# Patient Record
Sex: Female | Born: 1995 | Race: White | Hispanic: No | Marital: Single | Smoking: Never smoker
Health system: Southern US, Community
[De-identification: ages and names within clinical notes are randomized; demographics above are authoritative.]

## PROBLEM LIST (undated history)

## (undated) DIAGNOSIS — F909 Attention-deficit hyperactivity disorder, unspecified type: Secondary | ICD-10-CM

## (undated) DIAGNOSIS — J309 Allergic rhinitis, unspecified: Secondary | ICD-10-CM

## (undated) DIAGNOSIS — N6019 Diffuse cystic mastopathy of unspecified breast: Secondary | ICD-10-CM

## (undated) DIAGNOSIS — E669 Obesity, unspecified: Secondary | ICD-10-CM

## (undated) DIAGNOSIS — R519 Headache, unspecified: Secondary | ICD-10-CM

## (undated) DIAGNOSIS — A64 Unspecified sexually transmitted disease: Secondary | ICD-10-CM

## (undated) HISTORY — PX: TONSILLECTOMY: SUR1361

## (undated) HISTORY — DX: Attention-deficit hyperactivity disorder, unspecified type: F90.9

## (undated) HISTORY — DX: Obesity, unspecified: E66.9

## (undated) HISTORY — DX: Diffuse cystic mastopathy of unspecified breast: N60.19

## (undated) HISTORY — PX: TONSILLECTOMY AND ADENOIDECTOMY: SHX28

## (undated) HISTORY — DX: Headache, unspecified: R51.9

## (undated) HISTORY — DX: Unspecified sexually transmitted disease: A64

## (undated) HISTORY — DX: Allergic rhinitis, unspecified: J30.9

---
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
## 2016-03-31 ENCOUNTER — Other Ambulatory Visit
Admission: RE | Admit: 2016-03-31 | Discharge: 2016-03-31 | Disposition: A | Discharge status: Home / Self Care | Payer: Medicaid Other | Source: Ambulatory Visit | Attending: Pediatrics | Admitting: Pediatrics

## 2016-03-31 DIAGNOSIS — R109 Unspecified abdominal pain: Secondary | ICD-10-CM | POA: Diagnosis present

## 2016-03-31 LAB — COMPREHENSIVE METABOLIC PANEL
ALT: 20 U/L (ref 14–54)
ANION GAP: 6 (ref 5–15)
AST: 20 U/L (ref 15–41)
Albumin: 4.4 g/dL (ref 3.5–5.0)
Alkaline Phosphatase: 56 U/L (ref 38–126)
BILIRUBIN TOTAL: 0.4 mg/dL (ref 0.3–1.2)
BUN: 12 mg/dL (ref 6–20)
CO2: 29 mmol/L (ref 22–32)
Calcium: 9.6 mg/dL (ref 8.9–10.3)
Chloride: 104 mmol/L (ref 101–111)
Creatinine, Ser: 0.65 mg/dL (ref 0.44–1.00)
GFR calc Af Amer: >60 mL/min (ref >60)
Glucose, Bld: 77 mg/dL (ref 65–99)
POTASSIUM: 3.7 mmol/L (ref 3.5–5.1)
Sodium: 139 mmol/L (ref 135–145)
TOTAL PROTEIN: 7.9 g/dL (ref 6.5–8.1)

## 2016-03-31 LAB — CBC WITH DIFFERENTIAL/PLATELET
BASOS ABS: 0 10*3/uL (ref 0–0.1)
BASOS PCT: 1 %
EOS PCT: 2 %
Eosinophils Absolute: 0.2 10*3/uL (ref 0–0.7)
HCT: 41.9 % (ref 35.0–47.0)
Hemoglobin: 14.4 g/dL (ref 12.0–16.0)
LYMPHS PCT: 29 %
Lymphs Abs: 2.6 10*3/uL (ref 1.0–3.6)
MCH: 28.6 pg (ref 26.0–34.0)
MCHC: 34.3 g/dL (ref 32.0–36.0)
MCV: 83.3 fL (ref 80.0–100.0)
MONO ABS: 0.8 10*3/uL (ref 0.2–0.9)
MONOS PCT: 9 %
Neutro Abs: 5.2 10*3/uL (ref 1.4–6.5)
Neutrophils Relative %: 59 %
PLATELETS: 228 10*3/uL (ref 150–440)
RBC: 5.03 MIL/uL (ref 3.80–5.20)
RDW: 12.6 % (ref 11.5–14.5)
WBC: 8.8 10*3/uL (ref 3.6–11.0)

## 2016-03-31 LAB — LIPASE, BLOOD: Lipase: 20 U/L (ref 11–51)

## 2016-03-31 LAB — TSH: TSH: 2.4 u[IU]/mL (ref 0.350–4.500)

## 2016-03-31 LAB — AMYLASE: AMYLASE: 63 U/L (ref 28–100)

## 2016-04-01 LAB — VITAMIN D 25 HYDROXY (VIT D DEFICIENCY, FRACTURES): Vit D, 25-Hydroxy: 26.8 ng/mL — ABNORMAL LOW (ref 30.0–100.0)

## 2016-04-01 LAB — T4: T4, Total: 6.3 ug/dL (ref 4.5–12.0)

## 2016-11-07 ENCOUNTER — Emergency Department: Payer: Medicaid Other

## 2016-11-07 ENCOUNTER — Encounter: Payer: Self-pay | Admitting: Emergency Medicine

## 2016-11-07 ENCOUNTER — Emergency Department
Admission: EM | Admit: 2016-11-07 | Discharge: 2016-11-07 | Disposition: A | Discharge status: Home / Self Care | Payer: Medicaid Other | Attending: Emergency Medicine | Admitting: Emergency Medicine

## 2016-11-07 DIAGNOSIS — N390 Urinary tract infection, site not specified: Secondary | ICD-10-CM | POA: Insufficient documentation

## 2016-11-07 DIAGNOSIS — R319 Hematuria, unspecified: Secondary | ICD-10-CM

## 2016-11-07 DIAGNOSIS — R1011 Right upper quadrant pain: Secondary | ICD-10-CM

## 2016-11-07 LAB — URINALYSIS, COMPLETE (UACMP) WITH MICROSCOPIC
Bilirubin Urine: NEGATIVE
Glucose, UA: NEGATIVE mg/dL
Ketones, ur: 80 mg/dL — AB
Nitrite: NEGATIVE
PH: 7 (ref 5.0–8.0)
Protein, ur: NEGATIVE mg/dL
SPECIFIC GRAVITY, URINE: 1.019 (ref 1.005–1.030)

## 2016-11-07 LAB — COMPREHENSIVE METABOLIC PANEL
ALT: 22 U/L (ref 14–54)
AST: 23 U/L (ref 15–41)
Albumin: 4.4 g/dL (ref 3.5–5.0)
Alkaline Phosphatase: 54 U/L (ref 38–126)
Anion gap: 7 (ref 5–15)
BUN: 12 mg/dL (ref 6–20)
CALCIUM: 9.5 mg/dL (ref 8.9–10.3)
CHLORIDE: 103 mmol/L (ref 101–111)
CO2: 27 mmol/L (ref 22–32)
CREATININE: 0.65 mg/dL (ref 0.44–1.00)
Glucose, Bld: 96 mg/dL (ref 65–99)
Potassium: 3.9 mmol/L (ref 3.5–5.1)
Sodium: 137 mmol/L (ref 135–145)
Total Bilirubin: 1.5 mg/dL — ABNORMAL HIGH (ref 0.3–1.2)
Total Protein: 8.1 g/dL (ref 6.5–8.1)

## 2016-11-07 LAB — CBC
HCT: 39 % (ref 35.0–47.0)
Hemoglobin: 13.5 g/dL (ref 12.0–16.0)
MCH: 28 pg (ref 26.0–34.0)
MCHC: 34.6 g/dL (ref 32.0–36.0)
MCV: 81 fL (ref 80.0–100.0)
PLATELETS: 215 10*3/uL (ref 150–440)
RBC: 4.81 MIL/uL (ref 3.80–5.20)
RDW: 13.2 % (ref 11.5–14.5)
WBC: 5.9 10*3/uL (ref 3.6–11.0)

## 2016-11-07 LAB — LIPASE, BLOOD: LIPASE: 13 U/L (ref 11–51)

## 2016-11-07 MED ORDER — PHENAZOPYRIDINE HCL 200 MG PO TABS: 200.0000 mg | ORAL_TABLET | Freq: Three times a day (TID) | ORAL | 0 refills | Status: DC | PRN

## 2016-11-07 MED ORDER — SULFAMETHOXAZOLE-TRIMETHOPRIM 800-160 MG PO TABS: 1.0000 | ORAL_TABLET | Freq: Two times a day (BID) | ORAL | 0 refills | Status: DC

## 2016-11-07 NOTE — ED Provider Notes (Signed)
[facility] Emergency Department Provider Note        Time seen: ----------------------------------------- 11:11 AM on 11/07/2016 -----------------------------------------    I have reviewed the triage vital signs and the nursing notes.   HISTORY  Chief Complaint Abdominal Pain    HPI Sandra Hoover is a 21 y.o. female who presents to ER with right upper quadrant pain that started several days ago. Patient was seen by her primary care doctor and evaluated for possible UTI with culture came back negative. She does report dark colored urine, [facility] was [facility]. Patient states symptoms seem to be related to urination. She denies fevers, chills, vomiting or diarrhea.   History reviewed. No pertinent past medical history.  There are no active problems to display for this patient.   History reviewed. No pertinent surgical history.  Allergies Patient has no known allergies.  Social History Social History  Substance Use Topics  . Smoking status: Never Smoker  . Smokeless tobacco: Never Used  . Alcohol use No    Review of Systems Constitutional: Negative for fever. Cardiovascular: Negative for chest pain. Respiratory: Negative for shortness of breath. Gastrointestinal: Positive for abdominal pain Genitourinary: Positive for dysuria Musculoskeletal: Negative for back pain. Skin: Negative for rash. Neurological: Negative for headaches, focal weakness or numbness.  10-point ROS otherwise negative.  ____________________________________________   PHYSICAL EXAM:  VITAL SIGNS: ED Triage Vitals  Enc Vitals Group     BP 11/07/16 1020 130/69     Pulse Rate 11/07/16 1020 75     Resp 11/07/16 1020 20     Temp 11/07/16 1020 98.5 F (36.9 C)     Temp Source 11/07/16 1020 Oral     SpO2 11/07/16 1020 98 %     Weight 11/07/16 1013 237 lb (107.5 kg)     Height 11/07/16 1013 5\' 2"  (1.575 m)     Head  Circumference --      Peak Flow --      Pain Score 11/07/16 1013 4     Pain Loc --      Pain Edu? --      Excl. in GC? --     Constitutional: Alert and oriented. Well appearing and in no distress. Eyes: Conjunctivae are normal. PERRL. Normal extraocular movements. ENT   Head: Normocephalic and atraumatic.   Nose: No congestion/rhinnorhea.   Mouth/Throat: Mucous membranes are moist.   Neck: No stridor. Cardiovascular: Normal rate, regular rhythm. No murmurs, rubs, or gallops. Respiratory: Normal respiratory effort without tachypnea nor retractions. Breath sounds are clear and equal bilaterally. No wheezes/rales/rhonchi. Gastrointestinal: Right upper quadrant tenderness, rebound or guarding. Normal bowel sounds. Musculoskeletal: Nontender with normal range of motion in all extremities. No lower extremity tenderness nor edema. Neurologic:  Normal speech and language. No gross focal neurologic deficits are appreciated.  Skin:  Skin is warm, dry and intact. No rash noted. Psychiatric: Mood and affect are normal. Speech and behavior are normal.  ____________________________________________  ED COURSE:  Pertinent labs & imaging results that were available during my care of the patient were reviewed by me and considered in my medical decision making (see chart for details). Patient presents to ER no distress with right upper quadrant pain. We will assess with labs and likely ultrasound.   Procedures ____________________________________________   LABS (pertinent positives/negatives)  Labs Reviewed  COMPREHENSIVE METABOLIC PANEL - Abnormal; Notable for the following:       Result Value   Total Bilirubin 1.5 (*)  All other components within normal limits  URINALYSIS, COMPLETE (UACMP) WITH MICROSCOPIC - Abnormal; Notable for the following:    Color, Urine YELLOW (*)    APPearance CLOUDY (*)    Hgb urine dipstick LARGE (*)    Ketones, ur 80 (*)    Leukocytes, UA TRACE (*)     Bacteria, UA RARE (*)    Squamous Epithelial / LPF 0-5 (*)    All other components within normal limits  LIPASE, BLOOD  CBC  POC URINE PREG, ED    RADIOLOGY Images were viewed by me  Right upper quadrant ultrasound IMPRESSION: Suspected mild/ early hepatic steatosis. Otherwise, no explanation for patient's right upper quadrant abdominal pain. Specifically, no evidence of cholelithiasis/ cholecystitis. IMPRESSION: No explanation for patient's right upper quadrant abdominal pain. Specifically, no evidence of nephrolithiasis, enteric or urinary obstruction.  ____________________________________________  FINAL ASSESSMENT AND PLAN  Abdominal pain, UTI  Plan: Patient with labs and imaging as dictated above. No explanation for her right-sided abdominal pain. She does have evidence of UTI and she'll be treated for same. She is stable for outpatient follow-up with her doctor.   Emily Filbert, MD   Note: This note was generated in part or whole with voice recognition software. Voice recognition is usually quite accurate but there are transcription errors that can and very often do occur. I apologize for any typographical errors that were not detected and corrected.     Emily Filbert, MD 11/07/16 1310

## 2016-11-07 NOTE — ED Triage Notes (Signed)
Pt to ed with c/o right upper quad pain that started several days ago.  Was seen for UTI, but culture came back negative.  Pt reports dark colored urine.  [facility].

## 2016-11-07 NOTE — ED Notes (Signed)
RN confirmed with ED tech Crystal. POC urine preg completed and result is negative. CT informed of results.

## 2016-11-08 LAB — POCT PREGNANCY, URINE: Preg Test, Ur: NEGATIVE

## 2017-01-01 ENCOUNTER — Ambulatory Visit: Payer: Self-pay | Admitting: Certified Nurse Midwife

## 2017-01-20 ENCOUNTER — Encounter: Payer: Self-pay | Admitting: Certified Nurse Midwife

## 2017-01-20 ENCOUNTER — Ambulatory Visit (INDEPENDENT_AMBULATORY_CARE_PROVIDER_SITE_OTHER): Payer: Medicaid Other | Admitting: Certified Nurse Midwife

## 2017-01-20 VITALS — BP 122/78 | HR 107 | Ht 62.5 in | Wt 221.0 lb

## 2017-01-20 DIAGNOSIS — N926 Irregular menstruation, unspecified: Secondary | ICD-10-CM | POA: Diagnosis not present

## 2017-01-20 DIAGNOSIS — R109 Unspecified abdominal pain: Secondary | ICD-10-CM

## 2017-01-20 DIAGNOSIS — N898 Other specified noninflammatory disorders of vagina: Secondary | ICD-10-CM

## 2017-01-20 DIAGNOSIS — Z87440 Personal history of urinary (tract) infections: Secondary | ICD-10-CM

## 2017-01-20 DIAGNOSIS — Z113 Encounter for screening for infections with a predominantly sexual mode of transmission: Secondary | ICD-10-CM

## 2017-01-20 DIAGNOSIS — N946 Dysmenorrhea, unspecified: Secondary | ICD-10-CM | POA: Diagnosis not present

## 2017-01-20 LAB — POCT URINALYSIS DIPSTICK
Bilirubin, UA: NEGATIVE
Blood, UA: NEGATIVE
Glucose, UA: NEGATIVE
KETONES UA: 1.015
Nitrite, UA: NEGATIVE
PH UA: 6 (ref 5.0–8.0)
SPEC GRAV UA: 1.015 (ref 1.010–1.025)
UROBILINOGEN UA: 0.2 U/dL

## 2017-01-20 MED ORDER — AMOXICILLIN 875 MG PO TABS: 875.0000 mg | ORAL_TABLET | Freq: Two times a day (BID) | ORAL | 0 refills | Status: DC

## 2017-01-20 NOTE — Progress Notes (Signed)
Obstetrics & Gynecology Office Visit   Chief Complaint:  Chief Complaint  Patient presents with  . 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 She currently uses the Nexplanon for contraception. The Nexplanon was inserted 06/2015. She has been amenorrheic for about a year, then had a rather heavy menses in March that lasted 5-6 days and was accompanied by RUQ pain. She reports going to the ER and she was diagnosed with a UTI (E.coli) and was first treated with Ciprofloxacin which was later changed to Bactrim DS due to some side effects of the Ciprofloxacin. She also had a GB ultrasound and CT of the abdomen which were negative for gallstones and renal stones or renal obstruction. The pain resolved until her next menses in April. This pain also radiated to her upper right back. She does not have the pain currently. Has not had a menses this month. Denies nausea and vomiting accompaning the pain. Denies current problems with dysuria, hematuria,  or bad odor to urine. She has noticed more vaginal discharge, but denies fever, lower abdominal pain, or vulvovaginal irritation or itching.  She does not use condoms and has had a recent new partner. History of right flank pain associated with menses and irregular menses prior to Nexplanon insertion.   Review of Systems:  Review of Systems  Constitutional: Negative for chills and fever.       Has gained 29# since Nexplanon inserted  Gastrointestinal: Positive for abdominal pain (see HPI). Negative for constipation, diarrhea, nausea and vomiting.  Genitourinary: Negative for dysuria and hematuria.  Skin: Negative for itching and rash.  Neurological: Negative for dizziness and headaches.     Past Medical History:  Past Medical History:  Diagnosis Date  . ADHD (attention deficit  hyperactivity disorder)   . Allergic rhinitis   . Breast changes, fibrocystic     Past Surgical History:  Past Surgical History:  Procedure Laterality Date  . TONSILLECTOMY      Gynecologic History: Patient's last menstrual period was 12/05/2016 (exact date).    Family History:  Family History  Problem Relation Age of Onset  . Hypertension Mother   . Stroke Maternal Uncle   . Heart disease Maternal Grandmother   . Hypertension Maternal Grandmother   . Heart disease Maternal Grandfather   . Hypertension Maternal Grandfather     Social History:  Social History   Social History  . Marital status: Single    Spouse name: N/A  . Number of children: N/A  . Years of education: N/A   Occupational History  . Not on file.   Social History Main Topics  . Smoking status: Never Smoker  . Smokeless tobacco: Never Used  . Alcohol use No  . Drug use: No  . Sexual activity: Yes    Birth control/ protection: Implant   Other Topics Concern  . Not on file   Social History Narrative  . No narrative on file    Allergies:  No Known Allergies  Medications: Prior to Admission medications   Medication Sig Start Date End Date Taking? Authorizing Provider  etonogestrel (NEXPLANON) 68 MG IMPL implant 1 each by Subdermal route once.   Yes [provider]           Physical Exam Vitals: BP 122/78   Pulse Marland Kitchen(!)  107   Ht 5' 2.5" (1.588 m)   Wt 221 lb (100.2 kg)   LMP 12/05/2016 (Exact Date)   BMI 39.78 kg/m   Physical Exam  Constitutional: She is oriented to person, place, and time. She appears well-developed and well-nourished. No distress.  Eyes: No scleral icterus.  Neck: No thyromegaly present.  Cardiovascular: Normal rate and regular rhythm.   Respiratory: Effort normal and breath sounds normal.  GI: Soft. Bowel sounds are normal. She exhibits no distension and no mass. There is no tenderness. There is no guarding.  Genitourinary:  Genitourinary Comments:  Vulva: Labia majora appear irritated/ inflamed Vagina: green tinted mucoid discharge Cervix: no lesions, NT Uterus: nssc, mobile, NT Adnexa: no masses, NT. Difficult exam due to obesity  Neurological: She is alert and oriented to person, place, and time.  Skin: Skin is warm and dry.  Psychiatric: She has a normal mood and affect. Her behavior is normal. Thought content normal.  Back: no CVAT bilaterally  Results for orders placed or performed in visit on 01/20/17 (from the past 72 hour(s))  POCT Urinalysis Dipstick     Status: Abnormal   Collection Time: 01/20/17 12:23 PM  Result Value Ref Range   Color, UA yellow    Clarity, UA clear    Glucose, UA neg    Bilirubin, UA neg    Ketones, UA 1.015    Spec Grav, UA 1.015 1.010 - 1.025   Blood, UA neg    pH, UA 6.0 5.0 - 8.0   Protein, UA trace    Urobilinogen, UA 0.2 0.2 or 1.0 E.U./dL   Nitrite, UA neg    Leukocytes, UA Moderate (2+) (A) Negative  ketones should be large Wet prep: few clue cells, negative amine odor, TNTC WBCs, negative Trich, neg hyphae.  Assessment: 21 y.o. G0 with right upper abdominal pain sometimes radiating to back only when she is on menses Prior UTI with these symptoms, now with moderate leukocytes on urine dipstick. R/O UTI. Start Amoxicillin while awaiting urine culture.  Renal stones and gallstones already ruled out Vaginal discharge R/O STD   Plan: Start Amoxicillin 875 mgm BID for possible UTI while awaiting urine culture GC/ Chlamydia NAAT sent Will call with results.  Farrel Conners, CNM

## 2017-01-21 ENCOUNTER — Encounter: Payer: Self-pay | Admitting: Certified Nurse Midwife

## 2017-01-21 DIAGNOSIS — N926 Irregular menstruation, unspecified: Secondary | ICD-10-CM | POA: Insufficient documentation

## 2017-01-21 DIAGNOSIS — F909 Attention-deficit hyperactivity disorder, unspecified type: Secondary | ICD-10-CM | POA: Insufficient documentation

## 2017-01-21 DIAGNOSIS — N946 Dysmenorrhea, unspecified: Secondary | ICD-10-CM | POA: Insufficient documentation

## 2017-01-21 LAB — POCT WET PREP (WET MOUNT): KOH Wet Prep POC: ABSENT

## 2017-01-22 ENCOUNTER — Other Ambulatory Visit: Payer: Self-pay | Admitting: Certified Nurse Midwife

## 2017-01-22 ENCOUNTER — Telehealth: Payer: Self-pay

## 2017-01-22 LAB — URINE CULTURE

## 2017-01-22 LAB — GC/CHLAMYDIA PROBE AMP
CHLAMYDIA, DNA PROBE: POSITIVE — AB
NEISSERIA GONORRHOEAE BY PCR: NEGATIVE

## 2017-01-22 MED ORDER — AZITHROMYCIN 500 MG PO TABS: 1000.0000 mg | ORAL_TABLET | Freq: Once | ORAL | 0 refills | Status: AC

## 2017-01-22 NOTE — Telephone Encounter (Signed)
Pt called.  'WBC in .........."  [phone]

## 2017-01-22 NOTE — Telephone Encounter (Signed)
Patient called with results of positive Chlamydia culture. RX for Azithromycin called in to [facility] on [address]. Advised that partner needs to be treated. No IC until both treated x 1 week. Recommend condoms. Recommend annual appt in the next few months and follow up

## 2017-01-22 NOTE — Telephone Encounter (Signed)
Pt requesting call back with results. Did not release positive results. Please call pt. Thank you!

## 2017-01-25 ENCOUNTER — Encounter: Payer: Self-pay | Admitting: Certified Nurse Midwife

## 2017-03-11 ENCOUNTER — Ambulatory Visit (INDEPENDENT_AMBULATORY_CARE_PROVIDER_SITE_OTHER): Payer: Medicaid Other | Admitting: Certified Nurse Midwife

## 2017-03-11 ENCOUNTER — Encounter: Payer: Self-pay | Admitting: Certified Nurse Midwife

## 2017-03-11 VITALS — BP 122/82 | HR 78 | Ht 62.0 in | Wt 219.0 lb

## 2017-03-11 DIAGNOSIS — R1030 Lower abdominal pain, unspecified: Secondary | ICD-10-CM | POA: Diagnosis not present

## 2017-03-11 DIAGNOSIS — M546 Pain in thoracic spine: Secondary | ICD-10-CM

## 2017-03-11 DIAGNOSIS — R3911 Hesitancy of micturition: Secondary | ICD-10-CM | POA: Diagnosis not present

## 2017-03-11 DIAGNOSIS — M545 Low back pain, unspecified: Secondary | ICD-10-CM

## 2017-03-11 DIAGNOSIS — A749 Chlamydial infection, unspecified: Secondary | ICD-10-CM | POA: Diagnosis not present

## 2017-03-11 DIAGNOSIS — G8929 Other chronic pain: Secondary | ICD-10-CM | POA: Diagnosis not present

## 2017-03-11 LAB — POCT URINALYSIS DIPSTICK
Bilirubin, UA: NEGATIVE
Glucose, UA: NEGATIVE
KETONES UA: NEGATIVE
Leukocytes, UA: NEGATIVE
Nitrite, UA: NEGATIVE
RBC UA: NEGATIVE
SPEC GRAV UA: 1.01 (ref 1.010–1.025)
Urobilinogen, UA: NEGATIVE E.U./dL — AB
pH, UA: 7 (ref 5.0–8.0)

## 2017-03-11 NOTE — Progress Notes (Signed)
Obstetrics & Gynecology Office Visit   Chief Complaint:  Chief Complaint  Patient presents with  . 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 She had a positive test for Chlamydia and returns today for a TOC. She reports no further RUQ pain since being treated,but she has had no menses since treatment. She does complain of sharp stabbing pains in her lower abdomen "every few days."  She also continues to have right thoracic back pains. Has had this back/flank pain  since prior to her Nexplanon insertion 07/14/2015.  She was seen in the ER for the RUQ and right back/flank pain in March and was treated for a UTI.  A GB ultrasound and CTof the abdomen at that time  were both negative.. She is oligomenorrheic on the Nexplanon and has only had a menses in March and April of this year. She also complains of some discomfort in her left lower back recently. Has not taken any medication for her pain. Denies dysuria or hematuria, but has urgency and hesitation at times. Denies fever, chills, recent fall or injury. Reports having IBS, but has had no constipation or diarrhea recently. Last BM yesterday, no blood in stool.  MGM has endometriosis and she is worried that her pain is from endometriosis   Review of Systems:  Review of Systems  Constitutional: Negative for chills, fever and weight loss.  Gastrointestinal: Positive for abdominal pain. Negative for blood in stool, constipation, diarrhea, nausea and vomiting.  Genitourinary: Positive for flank pain (right) and urgency (intermittently). Negative for dysuria and hematuria.  Musculoskeletal: Positive for back pain.  Skin: Positive for rash.     Past Medical History:  Past Medical History:  Diagnosis Date  . ADHD (attention deficit hyperactivity disorder)   . Allergic rhinitis   . Breast changes, fibrocystic   . Obesity    . STD (sexually transmitted disease)    chlamydia    Past Surgical History:  Past Surgical History:  Procedure Laterality Date  . TONSILLECTOMY      Gynecologic History: Patient's last menstrual period was 12/05/2016 (approximate).   Family History:  Family History  Problem Relation Age of Onset  . Hypertension Mother   . Stroke Maternal Uncle   . Heart disease Maternal Grandmother   . Hypertension Maternal Grandmother   . Endometriosis Maternal Grandmother   . Heart disease Maternal Grandfather   . Hypertension Maternal Grandfather     Social History:  Social History   Social History  . Marital status: Single    Spouse name: N/A  . Number of children: N/A  . Years of education: N/A   Occupational History  . Not on file.   Social History Main Topics  . Smoking status: Never Smoker  . Smokeless tobacco: Never Used  . Alcohol use No  . Drug use: No  . Sexual activity: Yes    Birth control/ protection: Implant   Other Topics Concern  . Not on file   Social History Narrative  . No narrative on file    Allergies:  No Known Allergies  Medications: Prior to Admission medications   Medication Sig Start Date End Date Taking? Authorizing Provider  etonogestrel (NEXPLANON) 68 MG IMPL implant 1 each by Subdermal route once.   Yes [provider]           Physical Exam  Vitals: BP 122/82   Pulse 78   Ht 5\' 2"  (1.575 m)   Wt 99.3 kg (219 lb)   LMP 12/05/2016 (Approximate)   BMI 40.06 kg/m  Physical Exam  Constitutional: She is oriented to person, place, and time. She appears well-developed and well-nourished. No distress.  GI: Soft. Bowel sounds are normal. She exhibits no distension and no mass. There is no tenderness. There is no rebound and no guarding.  Genitourinary:  Genitourinary Comments: Vulva: no inflammation or lesions Vagina: white mucoepithelial discharge Cervix: nssc, mobile, NT Adnexa: no masses, NT  Musculoskeletal:  No CVAT  on right Tenderness over left SI joint  Neurological: She is alert and oriented to person, place, and time.  Skin: Skin is warm and dry.  Psychiatric: She has a normal mood and affect. Her behavior is normal.     Assessment: 21 y.o. G0P0000 with intermittent lower abdominal pain S/p treatment for Chlamydia SI joint pain Chronic right thoracic back pain  Plan: 1) Aptima TOC done 2) Pelvic ultrasound and follow up with MD regarding evaluation of lower abdominal/ pelvic pain 3) Recommend topical analgesics for SI joint pain/ Tylenol etc and if persists, rec seeing chiropractor

## 2017-03-16 LAB — GC/CHLAMYDIA PROBE AMP
Chlamydia trachomatis, NAA: NEGATIVE
NEISSERIA GONORRHOEAE BY PCR: NEGATIVE

## 2017-04-07 ENCOUNTER — Encounter: Payer: Self-pay | Admitting: Obstetrics and Gynecology

## 2017-04-07 ENCOUNTER — Ambulatory Visit (INDEPENDENT_AMBULATORY_CARE_PROVIDER_SITE_OTHER): Payer: Self-pay

## 2017-04-07 ENCOUNTER — Ambulatory Visit (INDEPENDENT_AMBULATORY_CARE_PROVIDER_SITE_OTHER): Payer: Self-pay | Admitting: Obstetrics and Gynecology

## 2017-04-07 VITALS — BP 102/72 | HR 91 | Ht 62.0 in | Wt 223.0 lb

## 2017-04-07 DIAGNOSIS — R102 Pelvic and perineal pain: Secondary | ICD-10-CM

## 2017-04-07 DIAGNOSIS — G8929 Other chronic pain: Secondary | ICD-10-CM

## 2017-04-07 DIAGNOSIS — R1030 Lower abdominal pain, unspecified: Secondary | ICD-10-CM

## 2017-04-07 DIAGNOSIS — N809 Endometriosis, unspecified: Secondary | ICD-10-CM

## 2017-04-07 NOTE — Patient Instructions (Signed)
Endometriosis Endometriosis is a condition in which the tissue that lines the uterus (endometrium) grows outside of its normal location. The tissue may grow in many locations close to the uterus, but it commonly grows on the ovaries, fallopian tubes, vagina, or bowel. When the uterus sheds the endometrium every menstrual cycle, there is bleeding wherever the endometrial tissue is located. This can cause pain because blood is irritating to tissues that are not normally exposed to it. What are the causes? The cause of endometriosis is not known. What increases the risk? You may be more likely to develop endometriosis if you:  Have a family history of endometriosis.  Have never given birth.  Started your period at age 10 or younger.  Have high levels of estrogen in your body.  Were exposed to a certain medicine (diethylstilbestrol) before you were born (in utero).  Had low birth weight.  Were born as a twin, triplet, or other multiple.  Have a BMI of less than 25. BMI is an estimate of body fat and is calculated from height and weight.  What are the signs or symptoms? Often, there are no symptoms of this condition. If you do have symptoms, they may:  Vary depending on where your endometrial tissue is growing.  Occur during your menstrual period (most common) or midcycle.  Come and go, or you may go months with no symptoms at all.  Stop with menopause.  Symptoms may include:  Pain in the back or abdomen.  Heavier bleeding during periods.  Pain during sex.  Painful bowel movements.  Infertility.  Pelvic pain.  Bleeding more than once a month.  How is this diagnosed? This condition is diagnosed based on your symptoms and a physical exam. You may have tests, such as:  Blood tests and urine tests. These may be done to help rule out other possible causes of your symptoms.  Ultrasound, to look for abnormal tissues.  An X-ray of the lower bowel (barium enema).  An  ultrasound that is done through the vagina (transvaginally).  CT scan.  MRI.  Laparoscopy. In this procedure, a lighted, pencil-sized instrument called a laparoscope is inserted into your abdomen through an incision. The laparoscope allows your health care provider to look at the organs inside your body and check for abnormal tissue to confirm the diagnosis. If abnormal tissue is found, your health care provider may remove a small piece of tissue (biopsy) to be examined under a microscope.  How is this treated? Treatment for this condition may include:  Medicines to relieve pain, such as NSAIDs.  Hormone therapy. This involves using artificial (synthetic) hormones to reduce endometrial tissue growth. Your health care provider may recommend using a hormonal form of birth control, or other medicines.  Surgery. This may be done to remove abnormal endometrial tissue. ? In some cases, tissue may be removed using a laparoscope and a laser (laparoscopic laser treatment). ? In severe cases, surgery may be done to remove the fallopian tubes, uterus, and ovaries (hysterectomy).  Follow these instructions at home:  Take over-the-counter and prescription medicines only as told by your health care provider.  Do not drive or use heavy machinery while taking prescription pain medicine.  Try to avoid activities that cause pain, including sexual activity.  Keep all follow-up visits as told by your health care provider. This is important. Contact a health care provider if:  You have pain in the area between your hip bones (pelvic area) that occurs: ? Before, during, or   after your period. ? In between your period and gets worse during your period. ? During or after sex. ? With bowel movements or urination, especially during your period.  You have problems getting pregnant.  You have a fever. Get help right away if:  You have severe pain that does not get better with medicine.  You have severe  nausea and vomiting, or you cannot eat without vomiting.  You have pain that affects only the lower, right side of your abdomen.  You have abdominal pain that gets worse.  You have abdominal swelling.  You have blood in your stool. This information is not intended to replace advice given to you by your health care provider. Make sure you discuss any questions you have with your health care provider. Document Released: 08/07/2000 Document Revised: 05/15/2016 Document Reviewed: 01/11/2016 [employer] Interactive Patient Education  2018 [employer]  

## 2017-04-07 NOTE — Progress Notes (Signed)
Gynecology Ultrasound Follow Up  Chief Complaint:  Chief Complaint  Patient presents with  . GYN U/S follow up    ovarian pain     History of Present Illness: Patient is a 21 y.o. female who presents today for ultrasound evaluation of pelvic pain .  Ultrasound demonstrates the following findgins Adnexa: Normal appearance Uterus: Non-enlarged, no fibroids with thin homogenous endometrial stripe  Additional: no free fluid  Also normal CT scan on 11/07/17  Symptoms have been long standing, initially starting over the last 4 years so not acute in onset and not correlated with her recent chlamydia infection.  No significant urinary or gastrointestinal symptoms noted by patient.  Review of Systems: Review of Systems  Constitutional: Negative for chills and fever.  HENT: Negative for congestion.   Respiratory: Negative for cough and shortness of breath.   Cardiovascular: Negative for chest pain and palpitations.  Gastrointestinal: Positive for abdominal pain. Negative for constipation, diarrhea, heartburn, nausea and vomiting.  Genitourinary: Negative for dysuria, frequency and urgency.  Skin: Negative for itching and rash.  Neurological: Negative for dizziness and headaches.  Endo/Heme/Allergies: Negative for polydipsia.  Psychiatric/Behavioral: Negative for depression.    Past Medical History:  Past Medical History:  Diagnosis Date  . ADHD (attention deficit hyperactivity disorder)   . Allergic rhinitis   . Breast changes, fibrocystic   . Obesity   . STD (sexually transmitted disease)    chlamydia    Past Surgical History:  Past Surgical History:  Procedure Laterality Date  . TONSILLECTOMY      Gynecologic History:  No LMP recorded. Patient has had an implant. Contraception: Nexplanon Last Pap: N/A under 22 Family History:  Family History  Problem Relation Age of Onset  . Hypertension Mother   . Stroke Maternal Uncle   . Heart disease Maternal Grandmother     . Hypertension Maternal Grandmother   . Endometriosis Maternal Grandmother   . Heart disease Maternal Grandfather   . Hypertension Maternal Grandfather     Social History:  Social History   Social History  . Marital status: Single    Spouse name: N/A  . Number of children: N/A  . Years of education: N/A   Occupational History  . Not on file.   Social History Main Topics  . Smoking status: Never Smoker  . Smokeless tobacco: Never Used  . Alcohol use No  . Drug use: No  . Sexual activity: Yes    Birth control/ protection: Implant   Other Topics Concern  . Not on file   Social History Narrative  . No narrative on file    Allergies:  No Known Allergies  Medications: Prior to Admission medications   Medication Sig Start Date End Date Taking? Authorizing Provider  amoxicillin (AMOXIL) 875 MG tablet Take 1 tablet (875 mg total) by mouth 2 (two) times daily. Patient not taking: Reported on 03/11/2017 01/20/17   Farrel ConnersGutierrez, Colleen, CNM  etonogestrel (NEXPLANON) 68 MG IMPL implant 1 each by Subdermal route once.    [provider]    Physical Exam Vitals: There were no vitals taken for this visit.  General: NAD HEENT: normocephalic, anicteric Pulmonary: No increased work of breathing Extremities: no edema, erythema, or tenderness Neurologic: Grossly intact, normal gait Psychiatric: mood appropriate, affect full   Assessment: 21 y.o. G0P0000 pelvic pain  Plan: Problem List Items Addressed This Visit    None    Visit Diagnoses    Chronic pelvic pain in female    -  Primary   Endometriosis          1)  Controlled substance reporting system report no scheduled prescription in the past 6 months  2) Ultrasound with normal findings  3) Given no imaging abnormalities and longstanding history we discussed that the only gyn etiology with this presentation would be endometriosis.  Nexplanon and other continuous hormonal contraception is actually excellent  treatment.  If [facility] are to be considered we discussed diagnostic laparoscopy to confirm the diagnosis.  Handout of endometriosis given to patient,.  4) A total of 15 minutes were spent in face-to-face contact with the patient during this encounter with over half of that time devoted to counseling and coordination of care.

## 2017-10-20 IMAGING — CT CT RENAL STONE PROTOCOL
2 of 4 series · 16 of 46 positions shown, 18 images · non-contrast
Comparison: Right upper quadrant abdominal pain - 11/07/2016

CLINICAL DATA: Pt to ed with c/o right upper quad pain that started
several days ago. Was seen for UTI, but culture came back negative.
Pt reports dark colored urine.

EXAM:
CT ABDOMEN AND PELVIS WITHOUT CONTRAST
TECHNIQUE: Multidetector CT imaging of the abdomen and pelvis was performed
following the standard protocol without IV contrast.

[Series 2: stone full standard · axial · 0.84mm/px · z∈[-540,-94]mm · 13 of 97 slices shown, 15 images]
[im 4/97  soft-tissue]
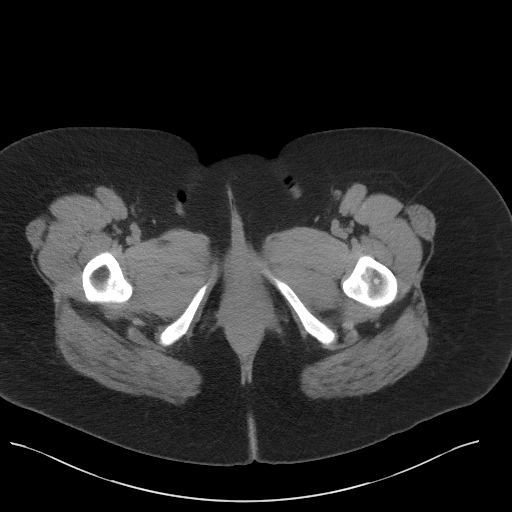
[im 4/97  bone]
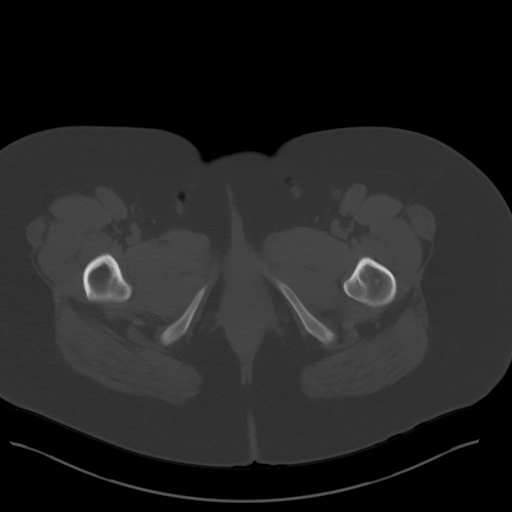
[im 12/97  soft-tissue]
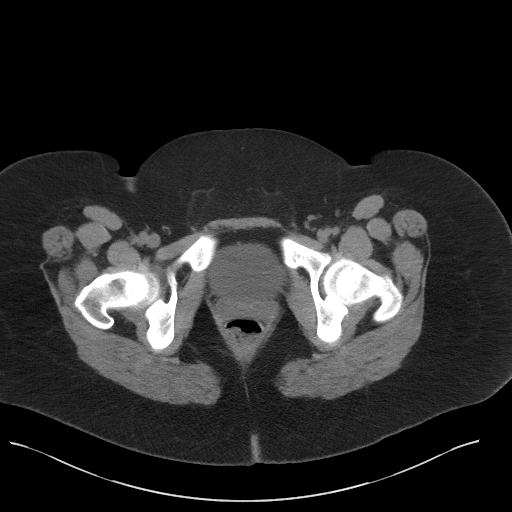
[im 20/97  soft-tissue]
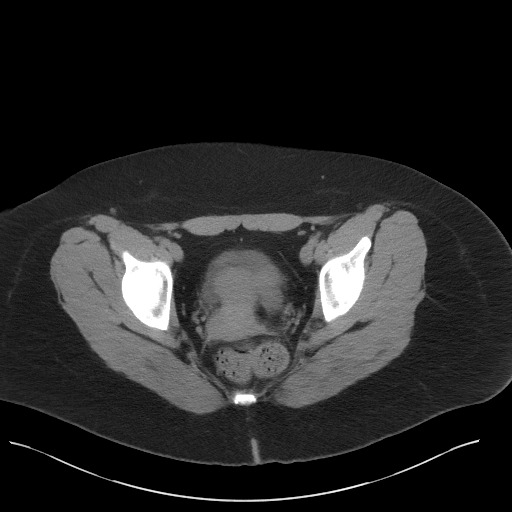
[im 27/97  soft-tissue]
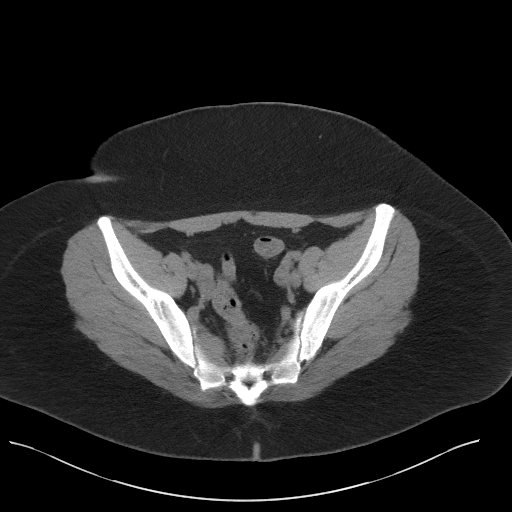
[im 35/97  soft-tissue]
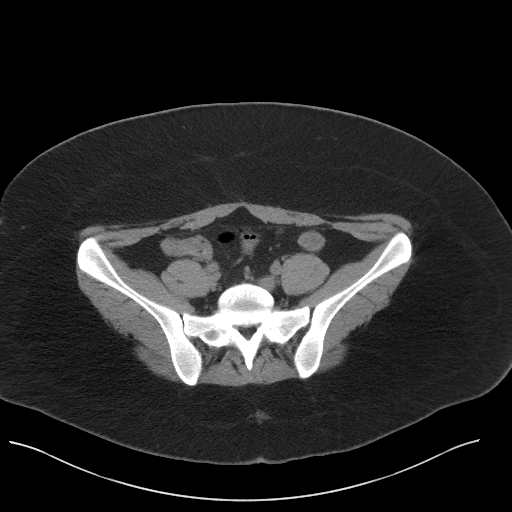
[im 43/97  soft-tissue]
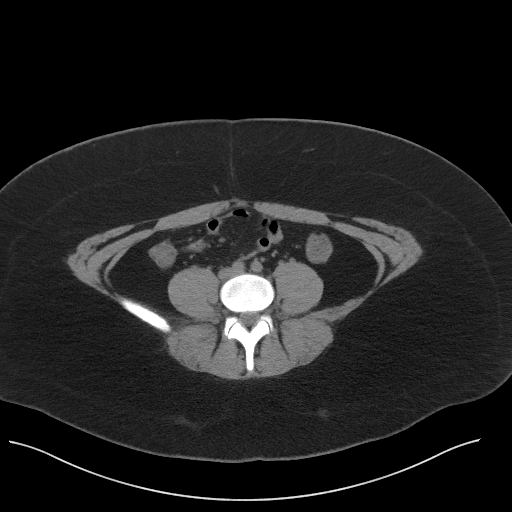
[im 50/97  soft-tissue]
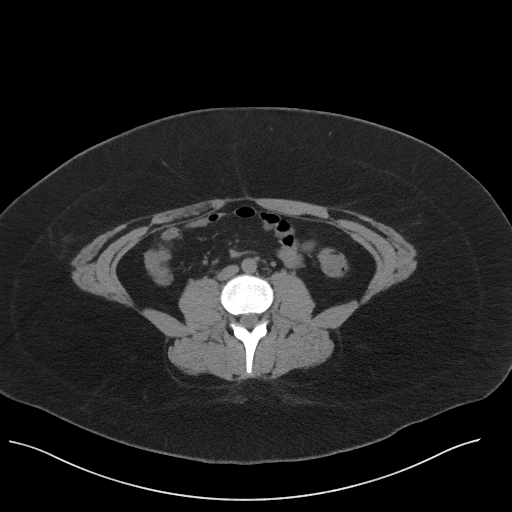
[im 54/97  soft-tissue]
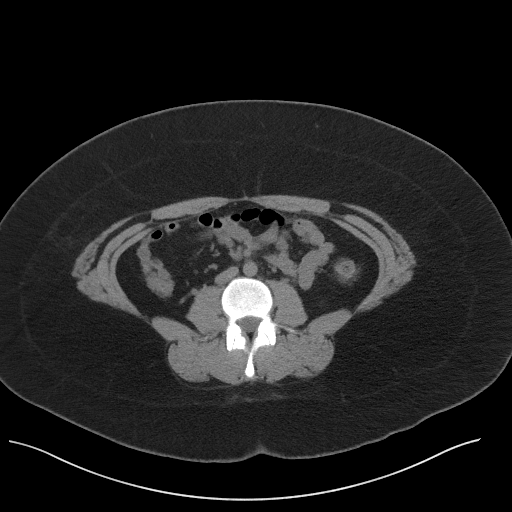
[im 62/97  soft-tissue]
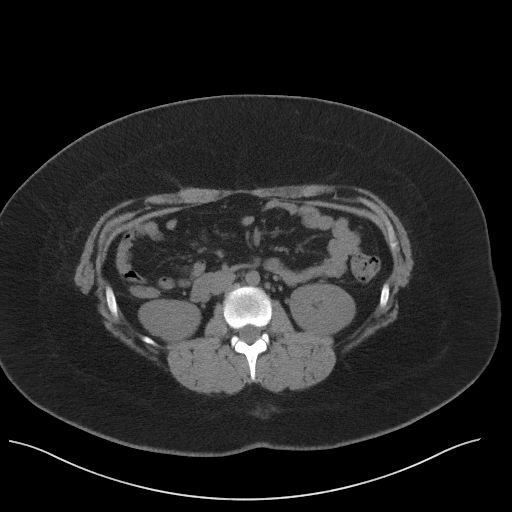
[im 62/97  bone]
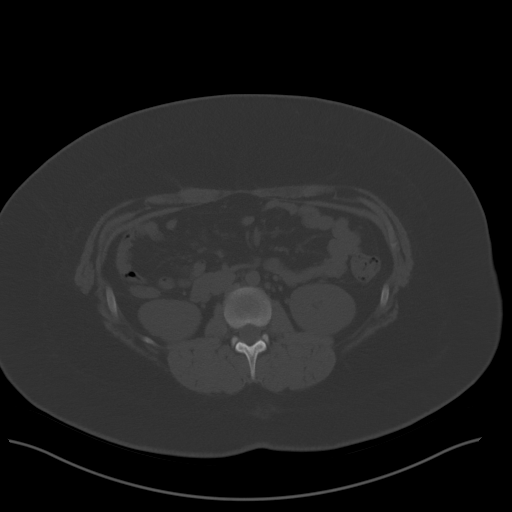
[im 70/97  soft-tissue]
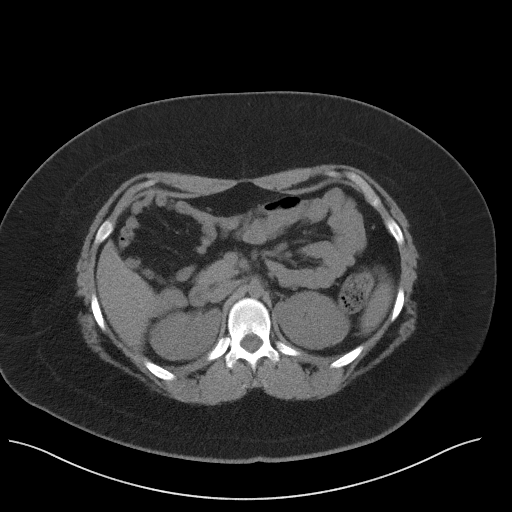
[im 77/97  soft-tissue]
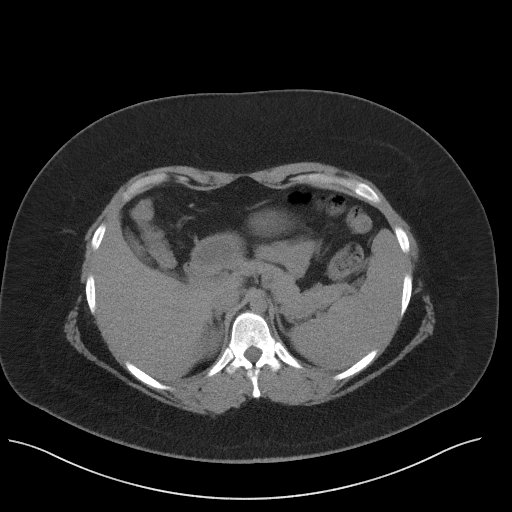
[im 85/97  soft-tissue]
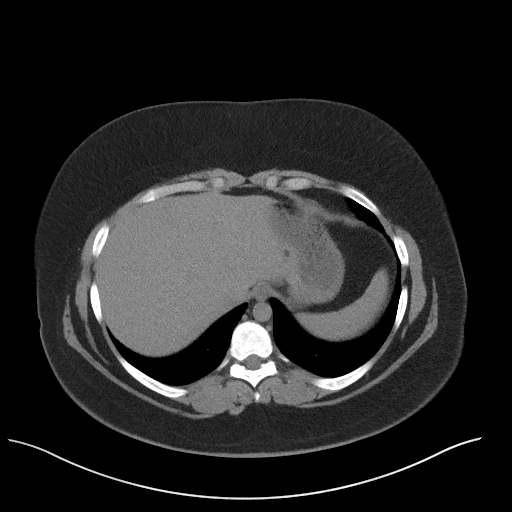
[im 93/97  soft-tissue]
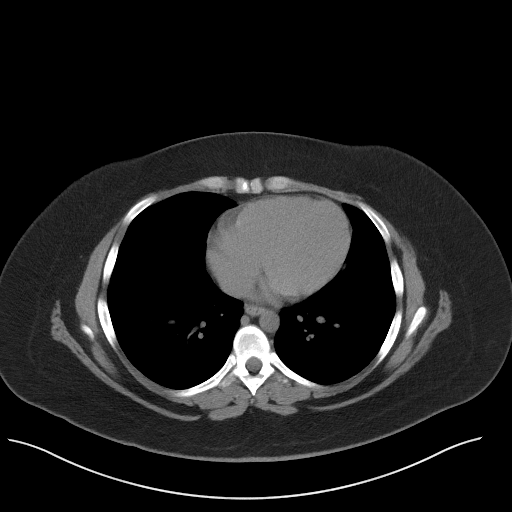

[Series 5: coronal · coronal · 0.81mm/px · 3 of 143 slices shown]
[im 48/143  soft-tissue]
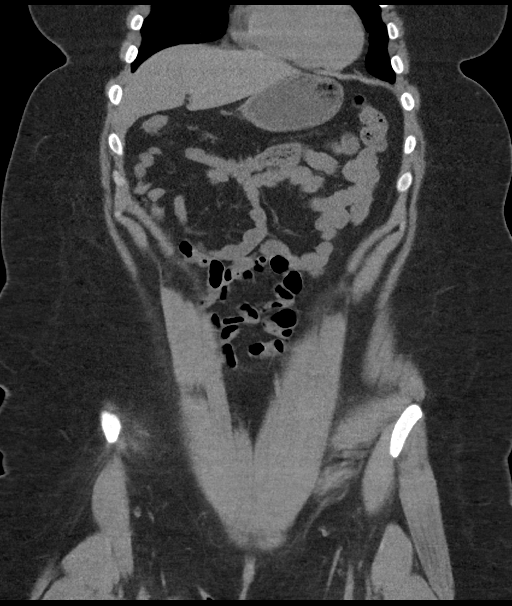
[im 64/143  soft-tissue]
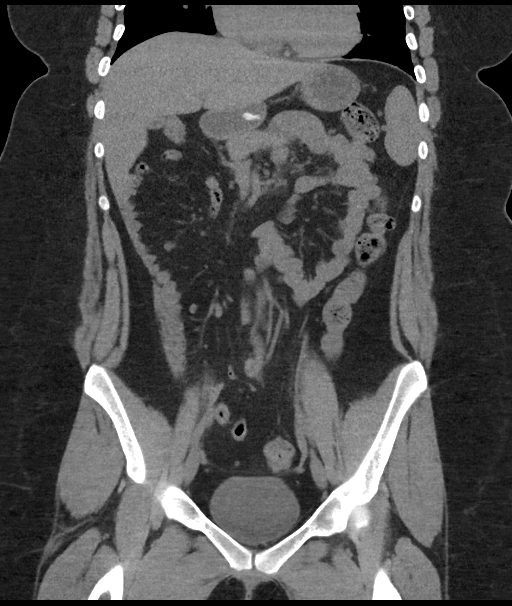
[im 79/143  soft-tissue]
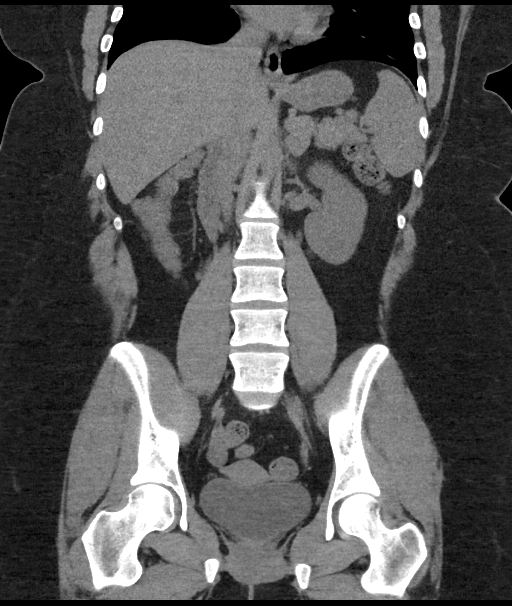

[16 of 46 positions shown; findings below may reference images not displayed]

FINDINGS: The lack of intravenous contrast limits the ability to evaluate
solid abdominal organs.

Lower chest: Limited visualization of the lower thorax is negative
for focal airspace opacity or pleural effusion.

Normal heart size.  No pericardial effusion.

Hepatobiliary: Normal hepatic contour. The gallbladder is
underdistention. No radiopaque gallstones. No ascites.

Pancreas: Normal noncontrast appearance of the pancreas

Spleen: Normal noncontrast appearance of the spleen. Note is made of
a small splenule.

Adrenals/Urinary Tract: Normal noncontrast appearance of the
bilateral kidneys. No radiopaque renal stones. No renal stones are
seen along expected course of either ureter or the urinary bladder.
Normal appearance of the urinary bladder given underdistention.

Normal noncontrast appearance the bilateral adrenal glands.

Stomach/Bowel: Moderate colonic stool burden without evidence of
enteric obstruction. The ascending colon is underdistended. Normal
noncontrast appearance of the terminal ileum and retrocecal
appendix. No pneumoperitoneum, pneumatosis or portal venous gas.

Vascular/Lymphatic: Normal caliber the abdominal aorta.

No bulky retroperitoneal, mesenteric, pelvic or inguinal
lymphadenopathy.

Reproductive: Normal noncontrast appearance of the pelvic organs. No
discrete adnexal lesion. No free fluid in the pelvic cul-de-sac.

Other: Regional soft tissues appear normal.

Musculoskeletal: No acute or aggressive osseous abnormalities.
IMPRESSION: No explanation for patient's right upper quadrant abdominal pain.
Specifically, no evidence of nephrolithiasis, enteric or urinary
obstruction.

## 2018-08-09 ENCOUNTER — Other Ambulatory Visit: Payer: Self-pay

## 2018-08-09 ENCOUNTER — Emergency Department
Admission: EM | Admit: 2018-08-09 | Discharge: 2018-08-09 | Disposition: A | Discharge status: Home / Self Care | Payer: BC Managed Care – PPO | Attending: Emergency Medicine | Admitting: Emergency Medicine

## 2018-08-09 ENCOUNTER — Encounter: Payer: Self-pay | Admitting: *Deleted

## 2018-08-09 DIAGNOSIS — Y939 Activity, unspecified: Secondary | ICD-10-CM | POA: Diagnosis not present

## 2018-08-09 DIAGNOSIS — Y999 Unspecified external cause status: Secondary | ICD-10-CM | POA: Insufficient documentation

## 2018-08-09 DIAGNOSIS — X58XXXA Exposure to other specified factors, initial encounter: Secondary | ICD-10-CM | POA: Insufficient documentation

## 2018-08-09 DIAGNOSIS — Y929 Unspecified place or not applicable: Secondary | ICD-10-CM | POA: Insufficient documentation

## 2018-08-09 DIAGNOSIS — S01542A Puncture wound with foreign body of oral cavity, initial encounter: Secondary | ICD-10-CM | POA: Insufficient documentation

## 2018-08-09 DIAGNOSIS — L089 Local infection of the skin and subcutaneous tissue, unspecified: Secondary | ICD-10-CM | POA: Insufficient documentation

## 2018-08-09 DIAGNOSIS — K130 Diseases of lips: Secondary | ICD-10-CM

## 2018-08-09 DIAGNOSIS — Z79899 Other long term (current) drug therapy: Secondary | ICD-10-CM | POA: Diagnosis not present

## 2018-08-09 DIAGNOSIS — S00551A Superficial foreign body of lip, initial encounter: Secondary | ICD-10-CM

## 2018-08-09 MED ORDER — AMOXICILLIN 500 MG PO CAPS: 500.0000 mg | ORAL_CAPSULE | Freq: Three times a day (TID) | ORAL | 0 refills | Status: DC

## 2018-08-09 MED ORDER — LIDOCAINE HCL (PF) 1 % IJ SOLN
5.0000 mL | Freq: Once | INTRAMUSCULAR | Status: AC
  Administered 2018-08-09: 5 mL
  Filled 2018-08-09: qty 5

## 2018-08-09 MED ORDER — LIDOCAINE-EPINEPHRINE-TETRACAINE (LET) SOLUTION
3.0000 mL | Freq: Once | NASAL | Status: AC
  Administered 2018-08-09: 3 mL via TOPICAL
  Filled 2018-08-09: qty 3

## 2018-08-09 NOTE — ED Triage Notes (Addendum)
Per patient's report, patient had her upper lip between nose and lip pierced 9 days ago. Patient c/o swelling and pain underneath upper lip where the back is grown over. Patient went to a different piercing artist who tried to remove it, but it was too painful. Patient would like the back placed correctly or piercing removed entirely.

## 2018-08-09 NOTE — Discharge Instructions (Addendum)
Rinse your mouth with warm salt water.  Return to the emergency department if any infection.  Take the medication as prescribed.

## 2018-08-09 NOTE — ED Provider Notes (Signed)
[facility] Emergency Department Provider Note  ____________________________________________   None    (approximate)  I have reviewed the triage vital signs and the nursing notes.   HISTORY  Chief Complaint Oral Swelling    HPI Sandra Hoover is a 22 y.o. female presents emergency department stating that her piercing that is between her upper lip and nose has skin that has grown over the back of the piercing.  She denies any fever or chills.  She has had no pus or drainage from the site.  She states that the back is embedded and she needs to have it removed.    Past Medical History:  Diagnosis Date  . ADHD (attention deficit hyperactivity disorder)   . Allergic rhinitis   . Breast changes, fibrocystic   . Obesity   . STD (sexually transmitted disease)    chlamydia    Patient Active Problem List   Diagnosis Date Noted  . Chronic right-sided thoracic back pain 03/11/2017  . Lower abdominal pain 03/11/2017  . Irregular menses 01/21/2017  . Dysmenorrhea in adolescent 01/21/2017  . ADHD 01/21/2017    Past Surgical History:  Procedure Laterality Date  . TONSILLECTOMY      Prior to Admission medications   Medication Sig Start Date End Date Taking? Authorizing Provider  amoxicillin (AMOXIL) 500 MG capsule Take 1 capsule (500 mg total) by mouth 3 (three) times daily. 08/09/18   Kayleanna Lorman, Roselyn BeringSusan W, PA-C  cetirizine (ZYRTEC) 10 MG tablet Take 10 mg by mouth at bedtime. 03/25/17   [provider]  etonogestrel (NEXPLANON) 68 MG IMPL implant 1 each by Subdermal route once.    [provider]    Allergies Patient has no known allergies.  Family History  Problem Relation Age of Onset  . Hypertension Mother   . Stroke Maternal Uncle   . Heart disease Maternal Grandmother   . Hypertension Maternal Grandmother   . Endometriosis Maternal Grandmother   . Heart disease Maternal Grandfather   . Hypertension Maternal Grandfather      Social History Social History   Tobacco Use  . Smoking status: Never Smoker  . Smokeless tobacco: Never Used  Substance Use Topics  . Alcohol use: Yes  . Drug use: No    Review of Systems  Constitutional: No fever/chills Eyes: No visual changes. ENT: No sore throat. Respiratory: Denies cough Genitourinary: Negative for dysuria. Musculoskeletal: Negative for back pain. Skin: Negative for rash.  Positive for an embedded piercing    ____________________________________________   PHYSICAL EXAM:  VITAL SIGNS: ED Triage Vitals  Enc Vitals Group     BP 08/09/18 1408 (!) 111/57     Pulse Rate 08/09/18 1408 70     Resp 08/09/18 1408 18     Temp 08/09/18 1408 98.1 F (36.7 C)     Temp Source 08/09/18 1408 Oral     SpO2 08/09/18 1408 98 %     Weight 08/09/18 1405 210 lb (95.3 kg)     Height 08/09/18 1405 5\' 2"  (1.575 m)     Head Circumference --      Peak Flow --      Pain Score 08/09/18 1405 3     Pain Loc --      Pain Edu? --      Excl. in GC? --     Constitutional: Alert and oriented. Well appearing and in no acute distress. Eyes: Conjunctivae are normal.  Head: Atraumatic. Nose: No congestion/rhinnorhea. Mouth/Throat: Mucous membranes are moist.  The external piercing appears to be normal however the internal piercing is embedded in the skin Neck:  supple no lymphadenopathy noted Cardiovascular: Normal rate, regular rhythm. Heart sounds are normal Respiratory: Normal respiratory effort.  No retractions, lungs c t a  GU: deferred Musculoskeletal: FROM all extremities, warm and well perfused Neurologic:  Normal speech and language.  Skin:  Skin is warm, dry and intact. No rash noted. Psychiatric: Mood and affect are normal. Speech and behavior are normal.  ____________________________________________   LABS (all labs ordered are listed, but only abnormal results are displayed)  Labs Reviewed - No data to  display ____________________________________________   ____________________________________________  RADIOLOGY    ____________________________________________   PROCEDURES  Procedure(s) performed: LAT was applied to the lip, 1% Xylocaine was injected, foreign body was removed.  Patient tolerated procedure well.  She was placed on antibiotic.  Procedures    ____________________________________________   INITIAL IMPRESSION / ASSESSMENT AND PLAN / ED COURSE  Pertinent labs & imaging results that were available during my care of the patient were reviewed by me and considered in my medical decision making (see chart for details).   Patient is a 22 year old female presents emergency department due to having a piercing in the lip where the skin has overgrown the stud.  The foreign body was removed by using 1% Xylocaine and a incision.  Patient tolerated procedure well.  She is placed on amoxicillin.  She is to follow-up with her regular doctor if any problems.  Return emergency department worsening.  She states she understands will comply she was discharged stable condition.     As part of my medical decision making, I reviewed the following data within the electronic MEDICAL RECORD NUMBER Nursing notes reviewed and incorporated, Old chart reviewed, Notes from prior ED visits and [place] Controlled Substance Database  ____________________________________________   FINAL CLINICAL IMPRESSION(S) / ED DIAGNOSES  Final diagnoses:  Pierced lip infection  Foreign body in lip, initial encounter      NEW MEDICATIONS STARTED DURING THIS VISIT:  Discharge Medication List as of 08/09/2018  4:04 PM    START taking these medications   Details  amoxicillin (AMOXIL) 500 MG capsule Take 1 capsule (500 mg total) by mouth 3 (three) times daily., Starting Tue 08/09/2018, Normal         Note:  This document was prepared using Dragon voice recognition software and may include unintentional  dictation errors.    Faythe Ghee, PA-C 08/09/18 2345    Sharman Cheek, MD 08/09/18 [phone]

## 2019-01-18 IMAGING — US US ABDOMEN LIMITED
1 series · 14 of 25 positions shown · non-contrast
Comparison: None.

CLINICAL DATA: Right upper quadrant abdominal pain for the past 2
days. Patient is not NPO.

EXAM:
US ABDOMEN LIMITED - RIGHT UPPER QUADRANT

[Series 1: us abdomen limited · 0.22mm/px · 14 of 38 slices shown]
[im 1/38]
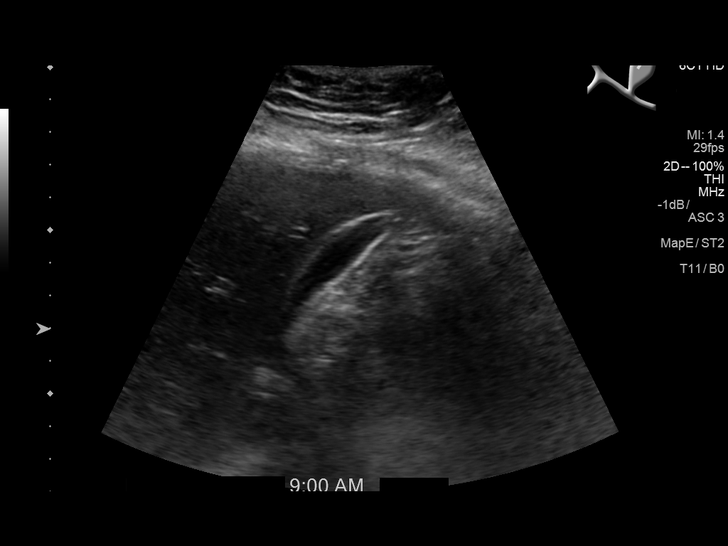
[im 4/38]
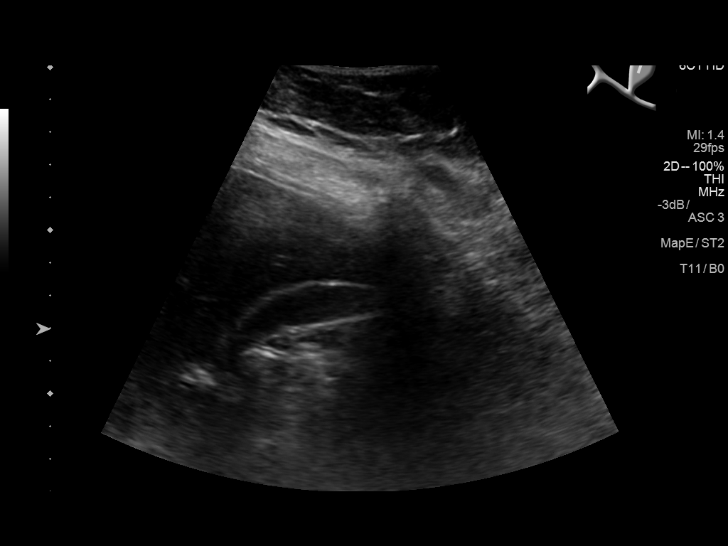
[im 7/38]
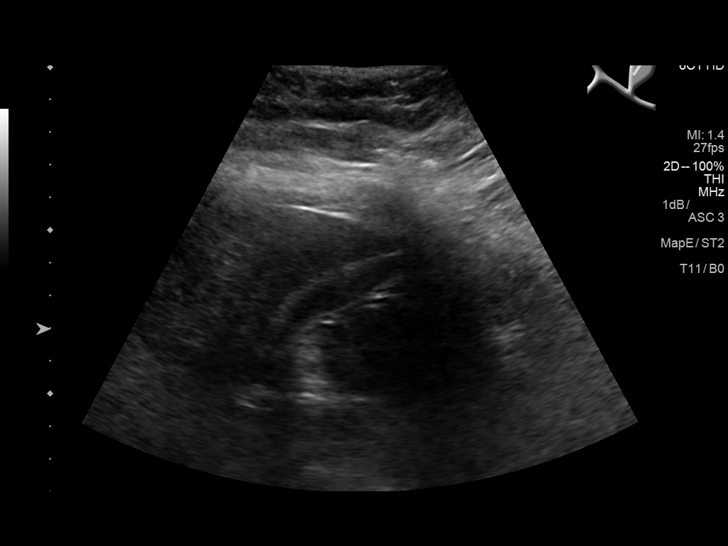
[im 10/38]
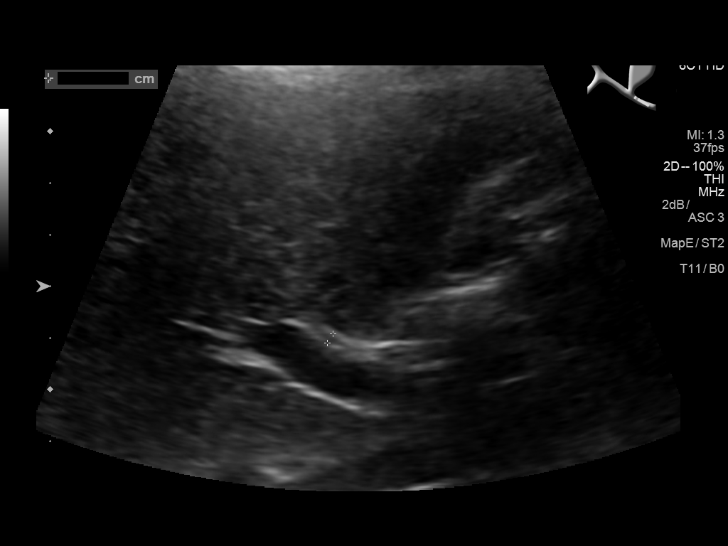
[im 13/38]
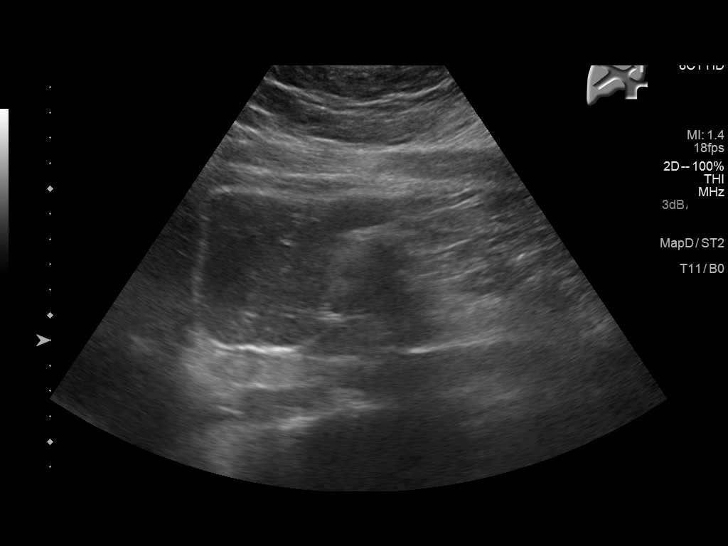
[im 14/38]
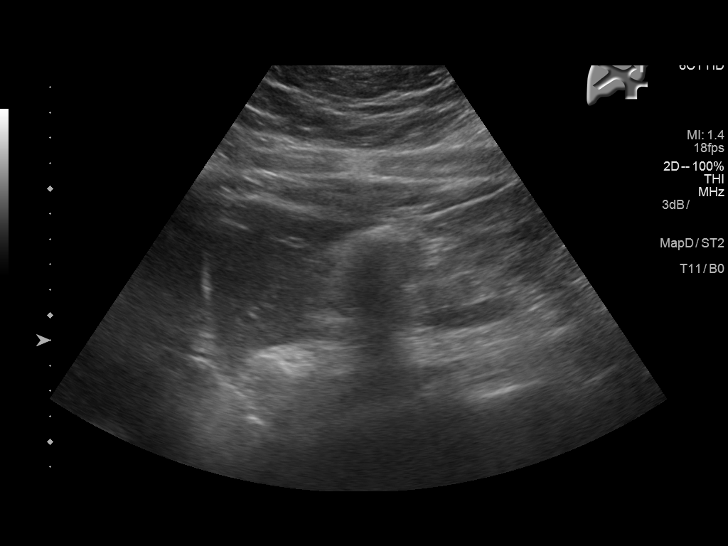
[im 17/38]
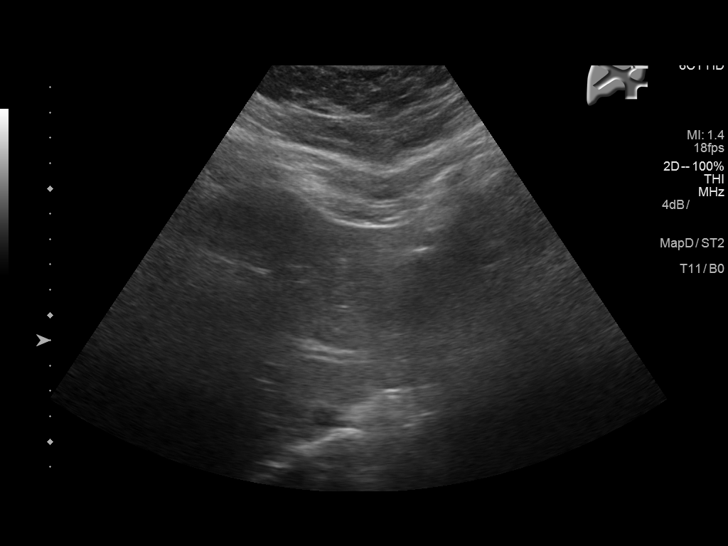
[im 21/38]
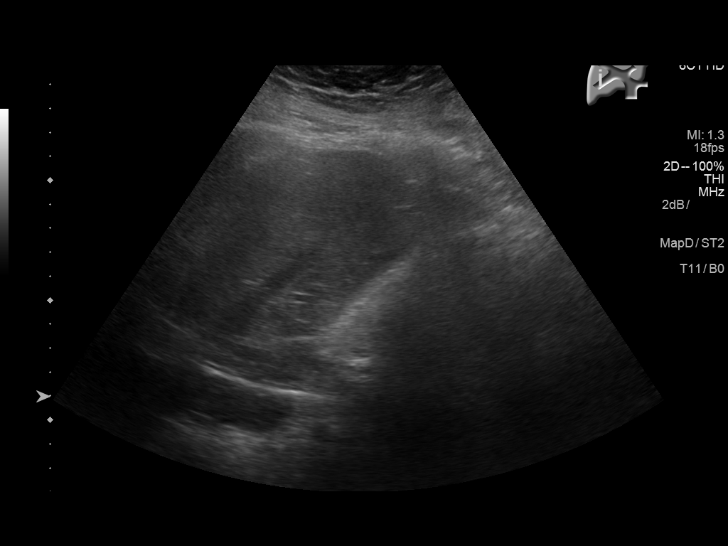
[im 24/38]
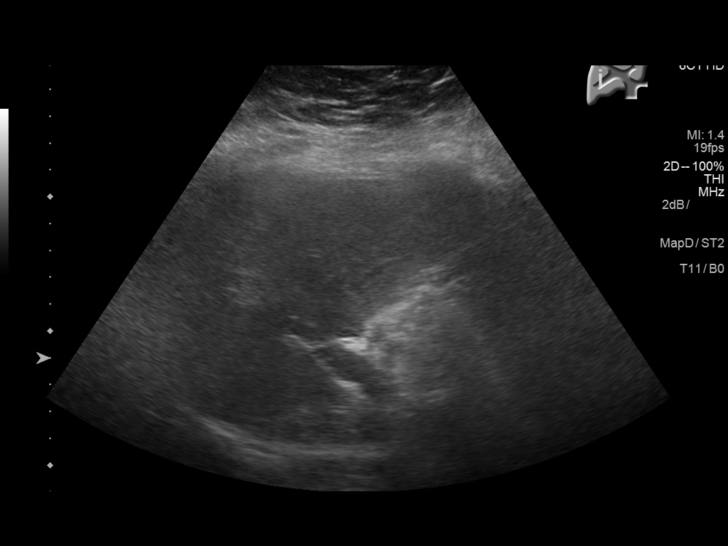
[im 25/38]
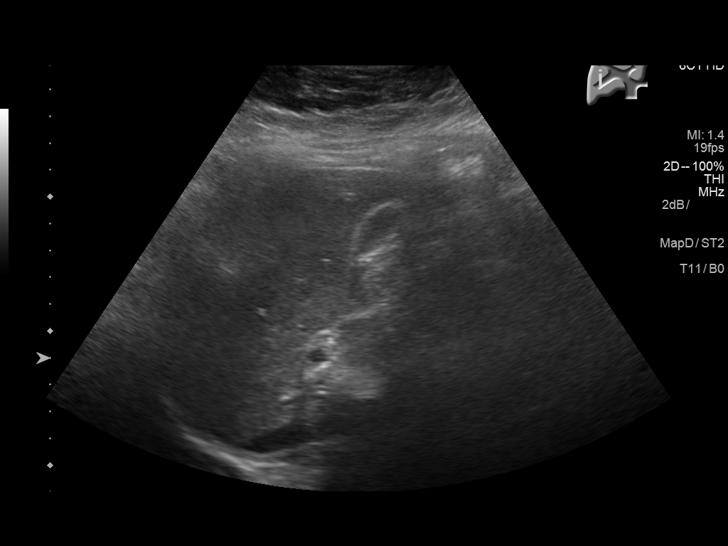
[im 28/38]
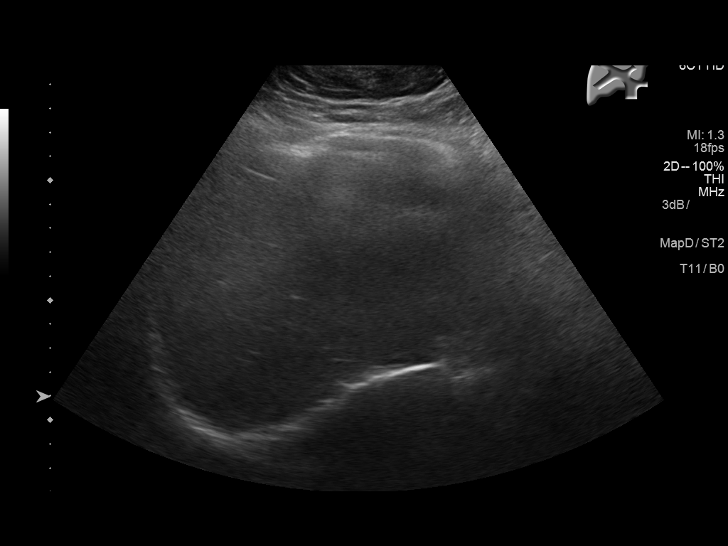
[im 31/38]
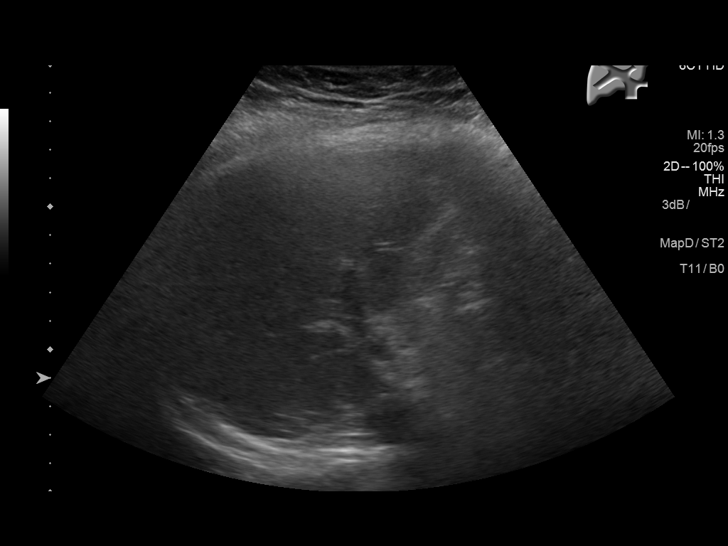
[im 34/38]
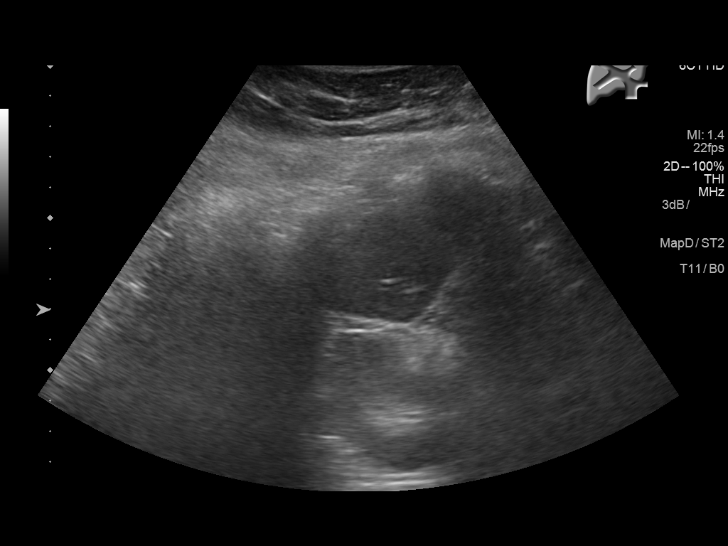
[im 38/38]
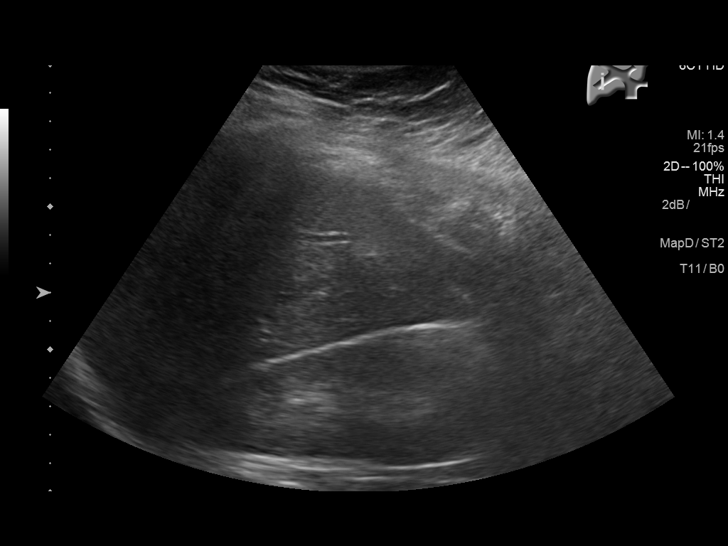

[14 of 25 positions shown; findings below may reference images not displayed]

FINDINGS: Examination is degraded secondary to patient body habitus and poor
sonographic window.

Gallbladder:

Normal sonographic appearance of the gallbladder given non NPO
state. No echogenic gallstones or biliary sludge. No gallbladder
wall thickening or pericholecystic fluid. Negative sonographic
Murphy's sign.

Common bile duct:

Diameter: Normal in size measuring 2.3 mm in diameter

Liver:

There is mild increased echogenicity of the hepatic parenchyma. No
discrete hepatic lesions. No intrahepatic biliary duct dilatation.
No ascites.
IMPRESSION: Suspected mild/ early hepatic steatosis. Otherwise, no explanation
for patient's right upper quadrant abdominal pain. Specifically, no
evidence of cholelithiasis/ cholecystitis.

## 2019-11-18 ENCOUNTER — Ambulatory Visit: Payer: Self-pay | Attending: Internal Medicine

## 2019-11-18 DIAGNOSIS — Z23 Encounter for immunization: Secondary | ICD-10-CM

## 2019-11-18 NOTE — Progress Notes (Signed)
   Covid-19 Vaccination Clinic  Name:  Sandra Hoover    MRN: [id number] DOB: August 30, 1995  11/18/2019  Sandra Hoover was observed post Covid-19 immunization for 15 minutes without incident. She was provided with Vaccine Information Sheet and instruction to access the V-Safe system.   Sandra Hoover was instructed to call 911 with any severe reactions post vaccine: Marland Kitchen Difficulty breathing  . Swelling of face and throat  . A fast heartbeat  . A bad rash all over body  . Dizziness and weakness   Immunizations Administered    Name Date Dose VIS Date Route   Pfizer COVID-19 Vaccine 11/18/2019  2:10 PM 0.3 mL 08/04/2019 Intramuscular   Manufacturer: [employer], [employer]   Lot: [id number]   NDC: [address]-7

## 2019-12-12 ENCOUNTER — Ambulatory Visit: Payer: Medicaid Other | Attending: Internal Medicine

## 2019-12-12 DIAGNOSIS — Z23 Encounter for immunization: Secondary | ICD-10-CM

## 2019-12-12 NOTE — Progress Notes (Signed)
   Covid-19 Vaccination Clinic  Name:  Samaya Boardley    MRN: [id number] DOB: Apr 12, 1996  12/12/2019  Ms. Vansickle was observed post Covid-19 immunization for 15 minutes without incident. She was provided with Vaccine Information Sheet and instruction to access the V-Safe system.   Ms. Woolworth was instructed to call 911 with any severe reactions post vaccine: Marland Kitchen Difficulty breathing  . Swelling of face and throat  . A fast heartbeat  . A bad rash all over body  . Dizziness and weakness   Immunizations Administered    Name Date Dose VIS Date Route   Pfizer COVID-19 Vaccine 12/12/2019 11:27 AM 0.3 mL 10/18/2018 Intramuscular   Manufacturer: [employer], [employer]   Lot: [id number]   NDC: [address]-7

## 2022-03-30 ENCOUNTER — Ambulatory Visit: Payer: BC Managed Care – PPO | Admitting: Psychiatry

## 2022-03-30 ENCOUNTER — Encounter: Payer: Self-pay | Admitting: Psychiatry

## 2022-03-30 VITALS — BP 80/52 | HR 72 | Temp 97.8°F | Ht 63.39 in | Wt 223.2 lb

## 2022-03-30 DIAGNOSIS — F1290 Cannabis use, unspecified, uncomplicated: Secondary | ICD-10-CM | POA: Diagnosis not present

## 2022-03-30 DIAGNOSIS — F3281 Premenstrual dysphoric disorder: Secondary | ICD-10-CM

## 2022-03-30 DIAGNOSIS — Z8659 Personal history of other mental and behavioral disorders: Secondary | ICD-10-CM | POA: Diagnosis not present

## 2022-03-30 DIAGNOSIS — F419 Anxiety disorder, unspecified: Secondary | ICD-10-CM | POA: Diagnosis not present

## 2022-03-30 DIAGNOSIS — F411 Generalized anxiety disorder: Secondary | ICD-10-CM | POA: Insufficient documentation
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
